# Patient Record
Sex: Female | Born: 1935 | Race: White | Hispanic: No | Smoking: Never smoker
Health system: Southern US, Community
[De-identification: ages and names within clinical notes are randomized; demographics above are authoritative.]

## PROBLEM LIST (undated history)

## (undated) DIAGNOSIS — K7460 Unspecified cirrhosis of liver: Secondary | ICD-10-CM

## (undated) DIAGNOSIS — R319 Hematuria, unspecified: Secondary | ICD-10-CM

## (undated) DIAGNOSIS — S72009A Fracture of unspecified part of neck of unspecified femur, initial encounter for closed fracture: Secondary | ICD-10-CM

## (undated) DIAGNOSIS — C229 Malignant neoplasm of liver, not specified as primary or secondary: Secondary | ICD-10-CM

## (undated) DIAGNOSIS — M1990 Unspecified osteoarthritis, unspecified site: Secondary | ICD-10-CM

## (undated) HISTORY — PX: SPLENECTOMY, TOTAL: SHX788

## (undated) HISTORY — PX: WRIST SURGERY: SHX841

## (undated) HISTORY — PX: ABDOMINAL HYSTERECTOMY: SHX81

## (undated) HISTORY — PX: APPENDECTOMY: SHX54

## (undated) HISTORY — PX: HIP SURGERY: SHX245

---

## 2010-06-30 ENCOUNTER — Emergency Department (HOSPITAL_BASED_OUTPATIENT_CLINIC_OR_DEPARTMENT_OTHER)
Admission: EM | Admit: 2010-06-30 | Discharge: 2010-06-30 | Payer: Self-pay | Source: Home / Self Care | Admitting: Emergency Medicine

## 2010-09-13 LAB — CBC
HCT: 38.4 % (ref 36.0–46.0)
Hemoglobin: 13.5 g/dL (ref 12.0–15.0)
MCH: 33.3 pg (ref 26.0–34.0)
MCHC: 35.2 g/dL (ref 30.0–36.0)
MCV: 94.8 fL (ref 78.0–100.0)
Platelets: 220 10*3/uL (ref 150–400)
RBC: 4.05 MIL/uL (ref 3.87–5.11)
RDW: 14 % (ref 11.5–15.5)
WBC: 10.5 10*3/uL (ref 4.0–10.5)

## 2010-09-13 LAB — COMPREHENSIVE METABOLIC PANEL
ALT: 58 U/L — ABNORMAL HIGH (ref 0–35)
AST: 69 U/L — ABNORMAL HIGH (ref 0–37)
Albumin: 4.2 g/dL (ref 3.5–5.2)
Alkaline Phosphatase: 128 U/L — ABNORMAL HIGH (ref 39–117)
BUN: 23 mg/dL (ref 6–23)
CO2: 28 mEq/L (ref 19–32)
Calcium: 10.9 mg/dL — ABNORMAL HIGH (ref 8.4–10.5)
Chloride: 104 mEq/L (ref 96–112)
Creatinine, Ser: 0.6 mg/dL (ref 0.4–1.2)
GFR calc Af Amer: >60 mL/min (ref >60)
GFR calc non Af Amer: >60 mL/min (ref >60)
Glucose, Bld: 109 mg/dL — ABNORMAL HIGH (ref 70–99)
Potassium: 4.3 mEq/L (ref 3.5–5.1)
Sodium: 143 mEq/L (ref 135–145)
Total Bilirubin: 0.6 mg/dL (ref 0.3–1.2)
Total Protein: 8.2 g/dL (ref 6.0–8.3)

## 2010-09-13 LAB — DIFFERENTIAL
Basophils Absolute: 0 10*3/uL (ref 0.0–0.1)
Basophils Relative: 0 % (ref 0–1)
Eosinophils Absolute: 0.3 10*3/uL (ref 0.0–0.7)
Eosinophils Relative: 3 % (ref 0–5)
Lymphocytes Relative: 31 % (ref 12–46)
Lymphs Abs: 3.2 10*3/uL (ref 0.7–4.0)
Monocytes Absolute: 1.5 10*3/uL — ABNORMAL HIGH (ref 0.1–1.0)
Monocytes Relative: 14 % — ABNORMAL HIGH (ref 3–12)
Neutro Abs: 5.4 10*3/uL (ref 1.7–7.7)
Neutrophils Relative %: 52 % (ref 43–77)

## 2010-09-13 LAB — PROTIME-INR
INR: 0.95 (ref 0.00–1.49)
Prothrombin Time: 12.9 seconds (ref 11.6–15.2)

## 2010-09-13 LAB — APTT: aPTT: 28 seconds (ref 24–37)

## 2019-04-25 ENCOUNTER — Other Ambulatory Visit: Payer: Self-pay | Admitting: Surgical Oncology

## 2019-04-25 DIAGNOSIS — C220 Liver cell carcinoma: Secondary | ICD-10-CM

## 2019-05-02 ENCOUNTER — Other Ambulatory Visit: Payer: Self-pay

## 2019-05-02 ENCOUNTER — Encounter: Payer: Self-pay | Admitting: *Deleted

## 2019-05-02 ENCOUNTER — Ambulatory Visit
Admission: RE | Admit: 2019-05-02 | Discharge: 2019-05-02 | Disposition: A | Discharge status: Home / Self Care | Payer: Self-pay | Source: Ambulatory Visit | Attending: Surgical Oncology | Admitting: Surgical Oncology

## 2019-05-02 DIAGNOSIS — C220 Liver cell carcinoma: Secondary | ICD-10-CM

## 2019-05-02 HISTORY — PX: IR RADIOLOGIST EVAL & MGMT: IMG5224

## 2019-05-02 NOTE — Consult Note (Signed)
Chief Complaint: Liver tumor  Referring Physician(s): Arredondo,Mark  History of Present Illness: Michelle Osborne is a 85 y.o. female presenting as a scheduled consultation to [place] [facility], kindly referred by Dr. Adair Laundry of the surgical service of [facility].   Michelle Osborne joined me today by telemedicine visit, given the COVID situation.    Michelle Osborne explained to me that her newly discovered [facility] was identified recently on MRI, which was performed after abnormal AFP was discovered.   AFP is 185 on recent test.    She has a history of cirrhosis, and was being screened.  MRI shows a 2cm-3cm liver lesion with TR5 characteristics.  The lesion is high segment 8, adjacent to the diaphragm.  There is 1 small additional lesion in the right liver that is non-specific, though will require follow up imaging surveillance.   She reports some morning nausea occurring 2-3 days per week, occasionally treated with alka-seltzer, and no vomiting.  Otherwise, she is symptom free from GI standpoint. She denies any abdominal pain.  She does take tramadol for leg pain and back pain.    She denies any prior MI.  She tells me about 40 years ago she had a complication during surgery for hysterectomy, which sounds like a peri-operative stroke.  She has had a long recovery, with some residual motor weakness it seems.  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  She tells me that she has had ABI screening, which was negative.   Her PCP is Dr. Truman Hayward.    She has had discussion with Dr. Adair Laundry, and I have discussed her case with him as well.   No past medical history on file.  Social History: She lives alone and manages all of her affairs.  She has ECOG of 1 She is a widow, 10 years ago. Her son died unfortunately of MCV (truck driver) recently Her daughter died unfortunately of cancer recently.   Allergies: Patient has no allergy  information on record.  Medications: Prior to Admission medications       150mg  Levothyroxine 140mg  lasix Potassium supplement Gabapentin HCTC 1-2mg  lorazepam Folic acid Tramadol prn 40mg  pantoprazole amlodipine   No family history on file.  Social History   Socioeconomic History  . Marital status: Widowed    Spouse name: Not on file  . Number of children: Not on file  . Years of education: Not on file  . Highest education level: Not on file  Occupational History  . Not on file  Social Needs  . Financial resource strain: Not on file  . Food insecurity    Worry: Not on file    Inability: Not on file  . Transportation needs    Medical: Not on file    Non-medical: Not on file  Tobacco Use  . Smoking status: Not on file  Substance and Sexual Activity  . Alcohol use: Not on file  . Drug use: Not on file  . Sexual activity: Not on file  Lifestyle  . Physical activity    Days per week: Not on file    Minutes per session: Not on file  . Stress: Not on file  Relationships  . Social Herbalist on phone: Not on file    Gets together: Not on file    Attends religious service: Not on file    Active member of club or organization: Not on  file    Attends meetings of clubs or organizations: Not on file    Relationship status: Not on file  Other Topics Concern  . Not on file  Social History Narrative  . Not on file    ECOG Status: 0 - Asymptomatic  Review of Systems  Review of Systems: A 12 point ROS discussed and pertinent positives are indicated in the HPI above.  All other systems are negative.  Physical Exam No direct physical exam was performed (except for noted visual exam findings with Video Visits).    Vital Signs: There were no vitals taken for this visit.  Imaging: No results found.  Labs:  CBC: No results for input(s): WBC, HGB, HCT, PLT in the last 8760 hours.  COAGS: No results for input(s): INR, APTT in the last 8760 hours.   BMP: No results for input(s): NA, K, CL, CO2, GLUCOSE, BUN, CALCIUM, CREATININE, GFRNONAA, GFRAA in the last 8760 hours.  Invalid input(s): CMP  LIVER FUNCTION TESTS: No results for input(s): BILITOT, AST, ALT, ALKPHOS, PROT, ALBUMIN in the last 8760 hours.  TUMOR MARKERS: No results for input(s): AFPTM, CEA, CA199, CHROMGRNA in the last 8760 hours.  Assessment and Plan:  Michelle Osborne is an 85 yo female with cirrhosis, and newly discovered 2cm-3cm [name] in segment 8 of the liver, when AFP screening test was elevated.   AFP is 185, and she is HCC Stage A (early stage).    I spent the majority of our time discussing the relevant anatomy and treatment options of early stage HCC.  Specifically, we discussed surgical options as the gold standard, and Dr. Adair Laundry has already had consultation, as well as liver directed therapy.    Our options would include image guided tissue ablation, or alternatively intra-arterial therapy (TACE or y90).  Given the small size of the tumor, the location directly on the diaphragm, and her complicating anatomy (body habitus, low lung volumes/arching diaphragm and segment 8), I would strongly consider working her up with intention of y90 therapy for segmentectomy.    During my description of selecting patients, she did voice to me that her preference was a short term follow up with active surveillance strategy rather than pursuing treatment at this time.  She would prefer a 25-month follow up.  This is because she feels she has some health issues right now that she is burdened, and that she does not feel motivated to address the [facility] at this time.   I did emphasize that the tumor is likely malignant ( ~95% confidence level by TR characteristics/TIRADS data), and that while we would expect growth, it will likely be slow growing.  Also, I did share with her our expectation that this would have very low risk of metastatic disease in the short term.    I did not go so far as  to complete an informed consent at this time for either tissue ablation or for y90 IA therapy.   She would like to use active surveillance strategy.   Plan: - 3 month CTA abdomen (/BRTO liver protocol for possible y90), with follow up visit to discuss results and possible treatment vs continued active surveillance. - 3 month follow up AFP - I have advised her to observe her other physician appointments.   Thank you for this interesting consult.  I greatly enjoyed meeting Michelle Osborne and look forward to participating in their care.  A copy of this report was sent to the requesting provider on this date.  Electronically  Signed: Corrie Mckusick 05/02/2019, 11:49 AM   I spent a total of  40 Minutes   in remote  clinical consultation, greater than 50% of which was counseling/coordinating care for [facility], possible liver directed therapy, active surveillance of [place].    Visit type: Audio only (telephone). Audio (no video) only due to no video. Alternative for in-person consultation at [facility], [address], [place], [place]. This visit type was conducted due to national recommendations for restrictions regarding the COVID-19 Pandemic (e.g. social distancing).  This format is felt to be most appropriate for this patient at this time.  All issues noted in this document were discussed and addressed.

## 2019-08-14 ENCOUNTER — Other Ambulatory Visit: Payer: Self-pay | Admitting: Interventional Radiology

## 2019-08-14 ENCOUNTER — Other Ambulatory Visit: Payer: Self-pay

## 2019-08-14 ENCOUNTER — Other Ambulatory Visit: Payer: Self-pay | Admitting: Radiology

## 2019-08-14 DIAGNOSIS — C220 Liver cell carcinoma: Secondary | ICD-10-CM

## 2019-08-19 ENCOUNTER — Other Ambulatory Visit: Payer: Self-pay

## 2019-08-19 DIAGNOSIS — C220 Liver cell carcinoma: Secondary | ICD-10-CM

## 2019-09-04 ENCOUNTER — Telehealth: Payer: Self-pay

## 2019-09-04 NOTE — Telephone Encounter (Signed)
Phone call to patient to review instructions for 13 hr prep for CT w/ contrast on 09/05/19  at 1 pm. [facility]. Pt aware and verbalized understanding of instructions. Prescription: 12 AM/ Midnight tonight (09/04/19)- 50mg  Prednisone 6 AM 09/05/19- 50mg  Prednisone 12PM/ noon 09/05/19  - 50mg  Prednisone and 50mg  Benadryl  Pt reports "I had contrast in 1974[birth year]10[birth year] and I died from it". Pt reports an anaphylaxis type reaction, stating "I became short of breath, next thing I knew the Dr was on top of me giving me medication and doing CPR". I have advised the scheduler, [place] with [facility], and Dr. Corrie Mckusick (ordering physician) of her reaction and they have ordered her scan to be done at the hospital, but with a 13 hour prep called in. Pt has verbalized understanding of the instructions.

## 2019-09-12 ENCOUNTER — Ambulatory Visit
Admission: RE | Admit: 2019-09-12 | Discharge: 2019-09-12 | Disposition: A | Discharge status: Home / Self Care | Payer: Medicare Other | Source: Ambulatory Visit | Attending: Interventional Radiology | Admitting: Interventional Radiology

## 2019-09-12 ENCOUNTER — Encounter: Payer: Self-pay | Admitting: *Deleted

## 2019-09-12 ENCOUNTER — Other Ambulatory Visit: Payer: Self-pay

## 2019-09-12 HISTORY — PX: IR RADIOLOGIST EVAL & MGMT: IMG5224

## 2019-09-12 NOTE — Progress Notes (Signed)
Chief Complaint: [place]   Referring Physician(s): Arredondo,Mark  History of Present Illness: Trinnity Sleet is a 86 y.o. female presenting as a scheduled follow up to [place] [facility], for presumed [facility] and to continue discussion of her care using active surveillance strategy.   Ms Pautsch joined me today by telemedicine visit, given the COVID situation.  I confirmed identity using 2 personal identifiers.  Ms Penilla has been doing just fine with no recent hospitalizations, and with no changes in her health since the last time we spoke, which was our initial consult 05/02/2019.   She has a history of cirrhosis, and screening study and subsequent MRI discoverd a 2cm-3cm liver lesion with TR5 characteristics 04/11/2019.    The lesion is high segment 8, adjacent to the diaphragm.  There is 1 small additional lesion in the right liver that is non-specific, though will require follow up imaging surveillance.   After our initial discussion, we elected a surveillance strategy, as she felt like she wanted to get some of her affairs in order and was not ready for treatment.  She tells me that she lives by herself, as her husband passed about 11 years ago.   We did get an interval CT abdomen/pelvis which required a standard 13 hour prep for a prior contrast reaction in 1970's. This was uneventful, and she was very complimentary of the [facility] CT staff.   CT shows that the lesion is essentially the same size as prior, with no new lesions.    Her PCP is Dr. Truman Hayward.     Allergies: Contrast media [iodinated diagnostic agents], Losartan, Morphine and related, Codeine, Ketorolac, Minocycline, Oxycodone, Penicillins, Sulfa antibiotics, Tapentadol hcl, Tetracyclines & related, Nucynta [tapentadol], and Doxycycline  Medications: Prior to Admission medications   Not on File     No family history on file.  Social History   Socioeconomic History  . Marital status: Widowed    Spouse name: Not on file    . Number of children: Not on file  . Years of education: Not on file  . Highest education level: Not on file  Occupational History  . Not on file  Tobacco Use  . Smoking status: Not on file  Substance and Sexual Activity  . Alcohol use: Not on file  . Drug use: Not on file  . Sexual activity: Not on file  Other Topics Concern  . Not on file  Social History Narrative  . Not on file   Social Determinants of Health   Financial Resource Strain:   . Difficulty of Paying Living Expenses:   Food Insecurity:   . Worried About Charity fundraiser in the Last Year:   . Arboriculturist in the Last Year:   Transportation Needs:   . Film/video editor (Medical):   Marland Kitchen Lack of Transportation (Non-Medical):   Physical Activity:   . Days of Exercise per Week:   . Minutes of Exercise per Session:   Stress:   . Feeling of Stress :   Social Connections:   . Frequency of Communication with Friends and Family:   . Frequency of Social Gatherings with Friends and Family:   . Attends Religious Services:   . Active Member of Clubs or Organizations:   . Attends Archivist Meetings:   Marland Kitchen Marital Status:     ECOG Status: 0 - Asymptomatic  Review of Systems  Review of Systems: A 12 point ROS discussed and pertinent positives are indicated in the  HPI above.  All other systems are negative.  Physical Exam No direct physical exam was performed (except for noted visual exam findings with Video Visits).    Vital Signs: There were no vitals taken for this visit.  Imaging: No results found.  Labs:  CBC: No results for input(s): WBC, HGB, HCT, PLT in the last 8760 hours.  COAGS: No results for input(s): INR, APTT in the last 8760 hours.  BMP: No results for input(s): NA, K, CL, CO2, GLUCOSE, BUN, CALCIUM, CREATININE, GFRNONAA, GFRAA in the last 8760 hours.  Invalid input(s): CMP  LIVER FUNCTION TESTS: No results for input(s): BILITOT, AST, ALT, ALKPHOS, PROT, ALBUMIN in  the last 8760 hours.  TUMOR MARKERS: No results for input(s): AFPTM, CEA, CA199, CHROMGRNA in the last 8760 hours.  Assessment and Plan:  Ms Lave is 86 yo female with a right liver LIRADS 5 lesion, ~2-3cm, which represents early stage [facility] by BCLC staging.   She has elected to employ active surveillance strategy after our first visit.    Today I briefly reviewed her updated imaging, as well as the treatment options, which, broadly speaking, would be surgical resection, image guided ablation, or intra-arterial embolization with bland, drug-eluting, or y90 particles.    I did let her know that I think the only loco-regional therapy that VIR would offer would be arterial embolization of either bland, drug-eluting, or y90 strategy.  I do not thing the size and location of the lesion is amenable to safe and efficient ablation.   After our discussion today, she would like to continue using the active surveillance strategy, as she is not yet ready to commit to treatment.   We will plan for 4-5 month follow up MRI.   Plan: - Continue active surveillance strategy for LIRADS 5 right liver lesion.  - Repeat abdominal MRI in 4-5 months - We can share the results by phone and discuss any need to change course.      Electronically Signed: Corrie Mckusick 09/12/2019, 12:12 PM   I spent a total of    25 Minutes in remote  clinical consultation, greater than 50% of which was counseling/coordinating care for liver lesion, possible locoregional therapy, active surveillance.    Visit type: Audio only (telephone). Audio (no video) only due to patient's lack of internet/smartphone capability. Alternative for in-person consultation at [facility], [address], [place], [place]. This visit type was conducted due to national recommendations for restrictions regarding the COVID-19 Pandemic (e.g. social distancing).  This format is felt to be most appropriate for this patient at this time.  All  issues noted in this document were discussed and addressed.

## 2019-12-31 ENCOUNTER — Other Ambulatory Visit: Payer: Self-pay | Admitting: Interventional Radiology

## 2019-12-31 ENCOUNTER — Other Ambulatory Visit: Payer: Self-pay

## 2019-12-31 DIAGNOSIS — C220 Liver cell carcinoma: Secondary | ICD-10-CM

## 2020-01-09 ENCOUNTER — Other Ambulatory Visit: Payer: Self-pay | Admitting: *Deleted

## 2020-01-09 DIAGNOSIS — C220 Liver cell carcinoma: Secondary | ICD-10-CM

## 2020-01-28 ENCOUNTER — Encounter: Payer: Self-pay | Admitting: *Deleted

## 2020-01-28 ENCOUNTER — Ambulatory Visit
Admission: RE | Admit: 2020-01-28 | Discharge: 2020-01-28 | Disposition: A | Discharge status: Home / Self Care | Payer: Medicare Other | Source: Ambulatory Visit | Attending: Interventional Radiology | Admitting: Interventional Radiology

## 2020-01-28 ENCOUNTER — Other Ambulatory Visit: Payer: Self-pay

## 2020-01-28 DIAGNOSIS — C220 Liver cell carcinoma: Secondary | ICD-10-CM

## 2020-01-28 HISTORY — PX: IR RADIOLOGIST EVAL & MGMT: IMG5224

## 2020-01-28 NOTE — Progress Notes (Signed)
Chief Complaint: [facility]   Referring Physician(s): Dr. Beverely Pace  History of Present Illness: Michelle Osborne is a 86 y.o. female presenting as a scheduled follow up to [place] clinic today, for her continued care regarding active surveillance strategy of a presumed right liver HCC.   Michelle Osborne joins [place] today virtually, and we confirmed her identity with 2 personal identifiers.   We first met her 05/02/2019, when she was kindly referred by Dr. Adair Laundry to consider potential palliative/liver directed therapy for her right liver HCC.  She is not a surgical candidate for cure.   We last saw her in the office 09/12/2019.  Since then, she tells me she had an unfortunate fall at home, and he fractured her right hip.  This was early May, and she was then hospitalized requiring right hip arthoplasty/fixation.  She was in the hospital at [facility] for a few weeks, and then has been at a rehab facility until just recently doing rehab.  She now has been at home for a few weeks with 2 rehab visits per week.  She does live alone, but she has lots of help from her family and friends.    She feels she is able to perform most everything she needs to do at home, but does say that her right hip is not "where she thinks it should be" with recovery.    She does tell me that otherwise, the only other symptom that she has now is a nagging cough, without fever, rigors, chills.  I let her know that this is very unlikely to be related to the liver/tumor.   Imaging review/comparison, using a diagonal right-left measurement for each of the following:  -CTA performed 09/05/19 shows that the lesion on coronal image 46 of series #9 has a diagonal measurement of ~1m.  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      Allergies: Contrast media [iodinated diagnostic agents], Losartan, Morphine and related, Codeine, Ketorolac, Minocycline, Oxycodone, Penicillins, Sulfa antibiotics, Tapentadol hcl, Tetracyclines & related, Nucynta [tapentadol], and Doxycycline  Medications: Prior to Admission medications   Not on File     No family history on file.  Social History   Socioeconomic History  . Marital status: Widowed    Spouse name: Not on file  . Number of children: Not on file  . Years of education: Not on file  . Highest education level: Not on file  Occupational History  . Not on file  Tobacco Use  . Smoking status: Not on file  Substance and Sexual Activity  . Alcohol use: Not on file  . Drug use: Not on file  . Sexual activity: Not on file  Other Topics Concern  . Not on file  Social History Narrative  . Not on file   Social Determinants of Health   Financial Resource Strain:   . Difficulty of Paying Living Expenses:   Food Insecurity:   . Worried About RunnCharity fundraiserthe Last Year:   . Ran [employer]  in the Last Year:   Transportation Needs:   . Film/video editor (Medical):   Marland Kitchen Lack of Transportation (Non-Medical):   Physical Activity:   . Days of Exercise per Week:   . Minutes of Exercise per Session:   Stress:   . Feeling of Stress :   Social Connections:   . Frequency of Communication with Friends and Family:   . Frequency of Social Gatherings with Friends and Family:   . Attends Religious Services:   . Active Member of Clubs or Organizations:   . Attends Archivist Meetings:   Marland Kitchen Marital Status:     ECOG Status: 0 - Asymptomatic  Review of Systems  Review of Systems: A 12 point ROS  discussed and pertinent positives are indicated in the HPI above.  All other systems are negative.  Physical Exam No direct physical exam was performed (except for noted visual exam findings with Video Visits).    Vital Signs: There were no vitals taken for this visit.  Imaging: No results found.  Labs:  CBC: No results for input(s): WBC, HGB, HCT, PLT in the last 8760 hours.  COAGS: No results for input(s): INR, APTT in the last 8760 hours.  BMP: No results for input(s): NA, K, CL, CO2, GLUCOSE, BUN, CALCIUM, CREATININE, GFRNONAA, GFRAA in the last 8760 hours.  Invalid input(s): CMP  LIVER FUNCTION TESTS: No results for input(s): BILITOT, AST, ALT, ALKPHOS, PROT, ALBUMIN in the last 8760 hours.  TUMOR MARKERS: No results for input(s): AFPTM, CEA, CA199, CHROMGRNA in the last 8760 hours.  Assessment and Plan:  Michelle Osborne is a very pleasant 86 yo female with a right liver LIRADS-5 lesion, early [place].    We have been using active surveillance strategy, as she is not ready to commit to a therapy.  In fact, during today's discussion, she told me she felt very strongly that she would never accept any chemotherapy.  I did reinforce to her that I think the need for any systemic therapy for such a lesion is very unlikely.    Today I we reviewed her updated imaging, and former imaging as well as the treatment options, which, broadly speaking, would be surgical resection (for which she is not a candidate), image guided ablation, or intra-arterial embolization with bland, drug-eluting, or y90 particles.    I reminder her that I feel that ablation will be difficult to assure an adequate and safe treatment, and that I think the best loco-regional therapy that VIR would offer would be arterial embolization of either bland, drug-eluting, or y90 strategy. I would favor y90.   We also reviewed the concept of active surveillance, which I think is very reasonable given that there has been very  little change over time, her current decreased reserve since recent hip fracture/surgery, and her conservative nature.   After our discussion she has elected to proceed with active surveillance, with our next visit in December.   Plan: - Continue active surveillance strategy of early [place], right liver dome, with ~6 month follow up office visit in December with repeat AFP and MRI abdomen, without and with contrast - I have also encouraged her to follow up with her pulmonary team, given her complaint of cough.  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  - Continue current care     Electronically Signed: Corrie Mckusick  01/28/2020, 10:17 AM   I spent a total of    40 Minutes in remote  clinical consultation, greater than 50% of which was counseling/coordinating care for right liver early [facility], active surveillance strategy.    Visit type: Audio only (telephone). Audio (no video) only due to patient's lack of internet/smartphone capability. Alternative for in-person consultation at [facility], [place] [address] [place], [place], [place]. This visit type was conducted due to national recommendations for restrictions regarding the COVID-19 Pandemic (e.g. social distancing).  This format is felt to be most appropriate for this patient at this time.  All issues noted in this document were discussed and addressed.

## 2020-07-13 ENCOUNTER — Telehealth: Payer: Self-pay

## 2020-07-13 ENCOUNTER — Other Ambulatory Visit: Payer: Self-pay

## 2020-07-13 ENCOUNTER — Other Ambulatory Visit: Payer: Self-pay | Admitting: Interventional Radiology

## 2020-07-13 DIAGNOSIS — C220 Liver cell carcinoma: Secondary | ICD-10-CM

## 2020-07-13 MED ORDER — PREDNISONE 50 MG PO TABS: ORAL_TABLET | ORAL | 0 refills | Status: AC

## 2020-07-13 NOTE — Telephone Encounter (Signed)
Received a phone call from scheduler, Randa Spike, stating pt would need a 13 hour prep for a scan that Dr. Earleen Newport is ordering. I called and personally spoke to the pt regarding her allergies. Pt can not recall if she is allergic to CT contrast or MRI contrast but does state "it killed me and they brought me back".  I do see that the patient had an MRI in July 2022 but patient can not recall if she had a 13 hour prep before or not. Therefore, a 13 hour prep will be called in on this patient, per Dr. Earleen Newport, due to the patients severe reaction. Prednisone [facility]. Pt verbalized understanding of medications.

## 2020-07-23 ENCOUNTER — Other Ambulatory Visit: Payer: Self-pay

## 2020-07-23 ENCOUNTER — Emergency Department (HOSPITAL_BASED_OUTPATIENT_CLINIC_OR_DEPARTMENT_OTHER)
Admission: EM | Admit: 2020-07-23 | Discharge: 2020-07-23 | Disposition: A | Discharge status: Home / Self Care | Payer: Medicare Other | Attending: Emergency Medicine | Admitting: Emergency Medicine

## 2020-07-23 ENCOUNTER — Emergency Department (HOSPITAL_BASED_OUTPATIENT_CLINIC_OR_DEPARTMENT_OTHER): Payer: Medicare Other

## 2020-07-23 ENCOUNTER — Encounter (HOSPITAL_BASED_OUTPATIENT_CLINIC_OR_DEPARTMENT_OTHER): Payer: Self-pay | Admitting: *Deleted

## 2020-07-23 DIAGNOSIS — Z8505 Personal history of malignant neoplasm of liver: Secondary | ICD-10-CM | POA: Insufficient documentation

## 2020-07-23 DIAGNOSIS — Z79899 Other long term (current) drug therapy: Secondary | ICD-10-CM | POA: Diagnosis not present

## 2020-07-23 DIAGNOSIS — R319 Hematuria, unspecified: Secondary | ICD-10-CM | POA: Insufficient documentation

## 2020-07-23 DIAGNOSIS — N3289 Other specified disorders of bladder: Secondary | ICD-10-CM

## 2020-07-23 HISTORY — DX: Hematuria, unspecified: R31.9

## 2020-07-23 HISTORY — DX: Malignant neoplasm of liver, not specified as primary or secondary: C22.9

## 2020-07-23 HISTORY — DX: Unspecified cirrhosis of liver: K74.60

## 2020-07-23 HISTORY — DX: Fracture of unspecified part of neck of unspecified femur, initial encounter for closed fracture: S72.009A

## 2020-07-23 HISTORY — DX: Unspecified osteoarthritis, unspecified site: M19.90

## 2020-07-23 LAB — COMPREHENSIVE METABOLIC PANEL
ALT: 20 U/L (ref 0–44)
AST: 39 U/L (ref 15–41)
Albumin: 4 g/dL (ref 3.5–5.0)
Alkaline Phosphatase: 144 U/L — ABNORMAL HIGH (ref 38–126)
Anion gap: 12 (ref 5–15)
BUN: 18 mg/dL (ref 8–23)
CO2: 30 mmol/L (ref 22–32)
Calcium: 10.3 mg/dL (ref 8.9–10.3)
Chloride: 97 mmol/L — ABNORMAL LOW (ref 98–111)
Creatinine, Ser: 0.74 mg/dL (ref 0.44–1.00)
GFR, Estimated: >60 mL/min (ref >60)
Glucose, Bld: 118 mg/dL — ABNORMAL HIGH (ref 70–99)
Potassium: 3.2 mmol/L — ABNORMAL LOW (ref 3.5–5.1)
Sodium: 139 mmol/L (ref 135–145)
Total Bilirubin: 0.4 mg/dL (ref 0.3–1.2)
Total Protein: 8.5 g/dL — ABNORMAL HIGH (ref 6.5–8.1)

## 2020-07-23 LAB — CBC WITH DIFFERENTIAL/PLATELET
Abs Immature Granulocytes: 0.04 10*3/uL (ref 0.00–0.07)
Basophils Absolute: 0.1 10*3/uL (ref 0.0–0.1)
Basophils Relative: 1 %
Eosinophils Absolute: 0.6 10*3/uL — ABNORMAL HIGH (ref 0.0–0.5)
Eosinophils Relative: 4 %
HCT: 40.6 % (ref 36.0–46.0)
Hemoglobin: 13.5 g/dL (ref 12.0–15.0)
Immature Granulocytes: 0 %
Lymphocytes Relative: 15 %
Lymphs Abs: 1.8 10*3/uL (ref 0.7–4.0)
MCH: 30.9 pg (ref 26.0–34.0)
MCHC: 33.3 g/dL (ref 30.0–36.0)
MCV: 92.9 fL (ref 80.0–100.0)
Monocytes Absolute: 1.8 10*3/uL — ABNORMAL HIGH (ref 0.1–1.0)
Monocytes Relative: 14 %
Neutro Abs: 8.4 10*3/uL — ABNORMAL HIGH (ref 1.7–7.7)
Neutrophils Relative %: 66 %
Platelets: 311 10*3/uL (ref 150–400)
RBC: 4.37 MIL/uL (ref 3.87–5.11)
RDW: 17.4 % — ABNORMAL HIGH (ref 11.5–15.5)
WBC: 12.7 10*3/uL — ABNORMAL HIGH (ref 4.0–10.5)
nRBC: 0 % (ref 0.0–0.2)

## 2020-07-23 LAB — URINALYSIS, ROUTINE W REFLEX MICROSCOPIC

## 2020-07-23 LAB — URINALYSIS, MICROSCOPIC (REFLEX): RBC / HPF: >50 RBC/hpf (ref 0–5)

## 2020-07-23 MED ORDER — SODIUM CHLORIDE 0.9 % IV BOLUS
1000.0000 mL | Freq: Once | INTRAVENOUS | Status: AC
  Administered 2020-07-23: 1000 mL via INTRAVENOUS

## 2020-07-23 NOTE — ED Triage Notes (Signed)
Hematuria since last night. States she had the same after breaking her hip in May. Her MD is aware and said not to be concerned.

## 2020-07-23 NOTE — Discharge Instructions (Signed)
Please follow-up with urology.  He may have a bladder mass that will need further evaluation  Expect to have persistent hematuria so please stay hydrated. Continue taking your Lasix as prescribed by your doctor.  Return to ER if you are unable to urinate, severe bladder pain, vomiting, fevers

## 2020-07-23 NOTE — ED Provider Notes (Signed)
[facility] EMERGENCY DEPARTMENT Provider Note   CSN: [id number] Arrival date & time: 07/23/20  1455     History Chief Complaint  Patient presents with  . Hematuria    Michelle Osborne is a 87 y.o. female history of cirrhosis from liver cancer, here presenting with hematuria.  Patient has been having intermittent hematuria since April of last year.  She states that she was in the nursing home at that time and she had a urinalysis that just showed blood and was told that she just needs follow-up. She never followed up with urology.  Patient states that she had severe bladder pain yesterday.  She then was able to urinate and had a large clot that came out and persistent hematuria.  Patient states that she is not on any blood thinners.  Has no known bladder or kidney cancer  The history is provided by the patient.       Past Medical History:  Diagnosis Date  . Arthritis   . Cirrhosis of liver ([place])   . Hematuria   . Hip fracture ([place])   . Liver cancer ([place])     There are no problems to display for this patient.   Past Surgical History:  Procedure Laterality Date  . ABDOMINAL HYSTERECTOMY    . APPENDECTOMY    . HIP SURGERY    . IR RADIOLOGIST EVAL & MGMT  05/02/2019  . IR RADIOLOGIST EVAL & MGMT  09/12/2019  . IR RADIOLOGIST EVAL & MGMT  01/28/2020  . SPLENECTOMY, TOTAL    . WRIST SURGERY       OB History   No obstetric history on file.     No family history on file.  Social History   Tobacco Use  . Smoking status: Never Smoker  . Smokeless tobacco: Never Used  Substance Use Topics  . Alcohol use: Not Currently  . Drug use: Never    Home Medications Prior to Admission medications   Medication Sig Start Date End Date Taking? Authorizing Provider  amLODipine (NORVASC) 10 MG tablet Take by mouth. 04/20/20  Yes [provider]  ascorbic acid (VITAMIN C) 500 MG tablet Take by mouth. 11/08/19  Yes [provider]  B Complex Vitamins  (VITAMIN B COMPLEX) TABS Take 1 tablet by mouth daily.   Yes [provider]  calcium carbonate (OS-CAL - DOSED IN MG OF ELEMENTAL CALCIUM) 1250 (500 Ca) MG tablet Take by mouth. 11/08/19  Yes [provider]  flecainide (TAMBOCOR) 150 MG tablet Take 150 mg by mouth 2 (two) times daily. 04/30/20  Yes [provider]  gabapentin (NEURONTIN) 400 MG capsule Take by mouth. 01/23/18  Yes [provider]  hydrochlorothiazide (HYDRODIURIL) 12.5 MG tablet Take by mouth. 04/20/20  Yes [provider]  levothyroxine (SYNTHROID) 175 MCG tablet Take 175 mcg by mouth daily. 07/01/20  Yes [provider]  LORazepam (ATIVAN) 2 MG tablet Take 2 mg by mouth 3 (three) times daily as needed. 06/10/20  Yes [provider]  Multiple Vitamin (QUINTABS) TABS Take by mouth. 11/08/19  Yes [provider]  pantoprazole (PROTONIX) 40 MG tablet Take by mouth. 08/09/19 04/20/21 Yes [provider]  potassium chloride SA (KLOR-CON) 20 MEQ tablet Take by mouth. 09/01/17  Yes [provider]  predniSONE (DELTASONE) 10 MG tablet Take 2 tabs daily x 5 days, then 1.5 tabs daily x 5 days, then 1 tab daily x 5 days, then 0.5 tabs daily x 5 days 07/21/20  Yes  [provider]  senna-docusate (SENOKOT-S) 8.6-50 MG tablet Take by mouth. 11/08/19  Yes [provider]  predniSONE (DELTASONE) 50 MG tablet Pt to take 50 mg of prednisone on 08/05/20 at 9 pm, 08/06/20 at 3 am, and 08/06/20 9 am. Pt also to take 50 mg of benadryl at 08/06/20 at 9 am. 07/13/20   Corrie Mckusick, DO    Allergies    Contrast media [iodinated diagnostic agents], Losartan, Morphine and related, Codeine, Ketorolac, Minocycline, Oxycodone, Penicillins, Sulfa antibiotics, Tapentadol hcl, Tetracyclines & related, Nucynta [tapentadol], and Doxycycline  Review of Systems   Review of Systems  Genitourinary: Positive for hematuria.  All other systems reviewed and are  negative.   Physical Exam Updated Vital Signs BP (!) 152/68 (BP Location: Right Arm)   Pulse 68   Temp 97.8 F (36.6 C) (Oral)   Resp 18   Ht 5\' 10"  (1.778 m)   Wt 104.3 kg   SpO2 98%   BMI 33.00 kg/m   Physical Exam Vitals and nursing note reviewed.  Constitutional:      Appearance: Normal appearance.  HENT:     Head: Normocephalic.     Nose: Nose normal.     Mouth/Throat:     Mouth: Mucous membranes are moist.  Eyes:     Extraocular Movements: Extraocular movements intact.     Pupils: Pupils are equal, round, and reactive to light.  Cardiovascular:     Rate and Rhythm: Normal rate and regular rhythm.     Pulses: Normal pulses.     Heart sounds: Normal heart sounds.  Pulmonary:     Effort: Pulmonary effort is normal.  Abdominal:     General: Abdomen is flat.     Palpations: Abdomen is soft.     Comments: No suprapubic tenderness or CVA tenderness.  Musculoskeletal:        General: Normal range of motion.     Cervical back: Normal range of motion and neck supple.  Skin:    General: Skin is warm.     Capillary Refill: Capillary refill takes less than 2 seconds.  Neurological:     General: No focal deficit present.     Mental Status: She is alert and oriented to person, place, and time.  Psychiatric:        Mood and Affect: Mood normal.        Behavior: Behavior normal.     ED Results / Procedures / Treatments   Labs (all labs ordered are listed, but only abnormal results are displayed) Labs Reviewed  URINALYSIS, ROUTINE W REFLEX MICROSCOPIC - Abnormal; Notable for the following components:      Result Value   Color, Urine RED (*)    APPearance TURBID (*)    Glucose, UA   (*)    Value: TEST NOT REPORTED DUE TO COLOR INTERFERENCE OF URINE PIGMENT   Hgb urine dipstick   (*)    Value: TEST NOT REPORTED DUE TO COLOR INTERFERENCE OF URINE PIGMENT   Bilirubin Urine   (*)    Value: TEST NOT REPORTED DUE TO COLOR INTERFERENCE OF URINE PIGMENT   Ketones, ur    (*)    Value: TEST NOT REPORTED DUE TO COLOR INTERFERENCE OF URINE PIGMENT   Protein, ur   (*)    Value: TEST NOT REPORTED DUE TO COLOR INTERFERENCE OF URINE PIGMENT   Nitrite   (*)    Value: TEST NOT REPORTED DUE TO COLOR INTERFERENCE OF URINE PIGMENT   Leukocytes,Ua   (*)  Value: TEST NOT REPORTED DUE TO COLOR INTERFERENCE OF URINE PIGMENT   All other components within normal limits  URINALYSIS, MICROSCOPIC (REFLEX) - Abnormal; Notable for the following components:   Bacteria, UA RARE (*)    All other components within normal limits  CBC WITH DIFFERENTIAL/PLATELET - Abnormal; Notable for the following components:   WBC 12.7 (*)    RDW 17.4 (*)    Neutro Abs 8.4 (*)    Monocytes Absolute 1.8 (*)    Eosinophils Absolute 0.6 (*)    All other components within normal limits  COMPREHENSIVE METABOLIC PANEL - Abnormal; Notable for the following components:   Potassium 3.2 (*)    Chloride 97 (*)    Glucose, Bld 118 (*)    Total Protein 8.5 (*)    Alkaline Phosphatase 144 (*)    All other components within normal limits    EKG None  Radiology CT Renal Stone Study  Addendum Date: 07/23/2020   ADDENDUM REPORT: 07/23/2020 19:19 ADDENDUM: These results were called by telephone at the time of physician contact on 07/23/2020 at 7:19 pm to provider Evangelyn Crouse , who verbally acknowledged these results. Electronically Signed   By: Lovena Le M.D.   On: 07/23/2020 19:19   Result Date: 07/23/2020 CLINICAL DATA:  Hematuria, unknown cause. History of liver cancer. A post splenectomy, appendectomy, hysterectomy. EXAM: CT ABDOMEN AND PELVIS WITHOUT CONTRAST TECHNIQUE: Multidetector CT imaging of the abdomen and pelvis was performed following the standard protocol without IV contrast. COMPARISON:  MRI 01/24/2020, CT 10/28/2019 FINDINGS: Lower chest: Regions of scarring and likely some mild rounded atelectasis are noted in the lung bases, right greater than left. Cardiac size is top normal. Mitral  annular calcifications. Coronary artery calcifications. Hepatobiliary: Nodular hepatic surface contour. Hypertrophy of the left lobe, similar to prior. Heterogenous lesion at the dome of the right lobe liver corresponding well with a lesion better characterized on prior MR imaging. A second site of possible mass or lesion in the right lobe is less well appreciated this exam in the absence of contrast media. Few punctate calcifications are similar to prior gallbladder is surgically absent. No significant biliary dilatation or visible calcified gallstones. Pancreas: Partial fatty replacement of the pancreas. No pancreatic ductal dilatation or surrounding inflammatory changes. Spleen: Prior splenectomy with few small splenic remnants in the left upper quadrant. Adrenals/Urinary Tract: Normal adrenals. Kidneys are normally. Stable mild symmetric bilateral perinephric stranding, a nonspecific finding which may correlate with advanced age or decreased renal function. stable bilobed cyst arising from the upper pole left kidney with small thin septate calcification (Bosniak 2). No discernible suspicious renal lesion. No visible urolithiasis or hydronephrosis of portion of the distal right ureter is obscured by streak artifact from the patient's right hip prosthesis there is redemonstrated and likely increasing nodularity along the right posterolateral bladder base with additional heterogeneously attenuating material within the bladder lumen likely reflecting hemorrhagic products. Stomach/Bowel: Small sliding-type hiatal hernia. Stomach and duodenum are unremarkable. No small bowel thickening or dilatation. The appendix is surgically absent. No proximal colonic thickening or dilatation. Distal colonic diverticulosis is present with some mild stranding centered upon several culprit diverticulum in the mid sigmoid (2/64). No extraluminal gas organized abscess or collection. Vascular/Lymphatic: Atherosclerotic calcifications  within the abdominal aorta and branch vessels. No aneurysm or ectasia. No enlarged abdominopelvic lymph nodes. Reproductive: Uterus is surgically absent. No concerning adnexal lesions. No concerning adnexal lesions though portions of the pelvis are obscured by streak. Other: Postsurgical changes in the posterior  midline from prior spinal decompression as well as in the right hip soft tissues from prior arthroplasty. Mild body wall edema. Edematous changes centered upon the sigmoid, as above. No free air or fluid in the abdomen or pelvis. Musculoskeletal: Prior right hip arthroplasty resulting in streak artifact across the pelvis. No gross acute complication is seen. Levocurvature of the spine similar to prior with multilevel discogenic and facet degenerative changes. Some erosive and destructive changes seen along the left anterolateral aspect of the L5-S1 disc space are unchanged from prior with associated vacuum phenomenon favoring a degenerative/chronic process. Posterior decompressive surgical changes are noted as well. [place] spots IMPRESSION: 1. Persistent and likely increasing nodularity along the right posterolateral bladder base with additional heterogeneously attenuating material within the bladder lumen likely reflecting hemorrhagic products. Findings are concerning for underlying bladder malignancy. Recommend further evaluation with cystoscopy. No obstructive urolithiasis or resulting hydronephrosis is seen however. 2. Distal colonic diverticulosis with some mild stranding centered upon several culprit diverticulum in the mid sigmoid colon, could reflect mild acute uncomplicated diverticulitis. No extraluminal gas or organized abscess or collection. 3. Heterogenous lesion at the dome of the right lobe liver corresponding well with a lesion better characterized on prior MR imaging. A second site of possible mass or lesion in the right lobe liver is less well appreciated this exam in the absence of contrast  media. 4. Stable bilobed cyst arising from the upper pole left kidney with small thin septate calcification (Bosniak 2). No discernible suspicious renal lesion. 5. Prior splenectomy and cholecystectomy. 6. Aortic Atherosclerosis (ICD10-I70.0). Currently attempting to contact the ordering provider with a critical value result. Addendum will be submitted upon case discussion. Electronically Signed: By: Lovena Le M.D. On: 07/23/2020 19:13    Procedures Procedures (including critical care time)  Medications Ordered in ED Medications  sodium chloride 0.9 % bolus 1,000 mL (1,000 mLs Intravenous New Bag/Given 07/23/20 1831)    ED Course  I have reviewed the triage vital signs and the nursing notes.  Pertinent labs & imaging results that were available during my care of the patient were reviewed by me and considered in my medical decision making (see chart for details).    MDM Rules/Calculators/A&P                         Michelle Osborne is a 87 y.o. female here present with hematuria.  Patient has been having intermittent hematuria for the last several months.  Has a large clot yesterday and has persistent hematuria since then. Patient was able to urinate in the ED and bladder scan showed no residual.  Patient's creatinine is stable and her UA showed hematuria but no obvious UTI.  Patient unable to get IV contrast so Noncon CT was performed and showed nodularity on the right side of the bladder concerning for possible cancer. Since patient is hemodynamically stable and not in retention, patient is stable for discharge home with urology follow-up. Told her to return if she is unable to urinate or severe bladder pain    Final Clinical Impression(s) / ED Diagnoses Final diagnoses:  None    Rx / DC Orders ED Discharge Orders    None       Drenda Freeze, MD 07/23/20 2005

## 2020-08-11 ENCOUNTER — Other Ambulatory Visit: Payer: Self-pay

## 2020-08-11 ENCOUNTER — Ambulatory Visit
Admission: RE | Admit: 2020-08-11 | Discharge: 2020-08-11 | Disposition: A | Discharge status: Home / Self Care | Payer: Medicare Other | Source: Ambulatory Visit | Attending: Interventional Radiology | Admitting: Interventional Radiology

## 2020-08-11 DIAGNOSIS — C220 Liver cell carcinoma: Secondary | ICD-10-CM

## 2020-09-01 DEATH — deceased

## 2022-03-29 IMAGING — CT CT RENAL STONE PROTOCOL
2 of 4 series · 11 of 46 positions shown, 12 images · non-contrast
Comparison: MRI 01/24/2020, CT 10/28/2019
COMPARISON: MRI 01/24/2020, CT 10/28/2019

Addendum:
CLINICAL DATA: Hematuria, unknown cause. History of liver cancer. A
post splenectomy, appendectomy, hysterectomy.

EXAM:
CT ABDOMEN AND PELVIS WITHOUT CONTRAST
TECHNIQUE: Multidetector CT imaging of the abdomen and pelvis was performed
following the standard protocol without IV contrast.

[Series 2: axial st · axial · 0.98mm/px · z∈[+611,+1006]mm · 8 of 95 slices shown, 9 images]
[im 8/95  soft-tissue]
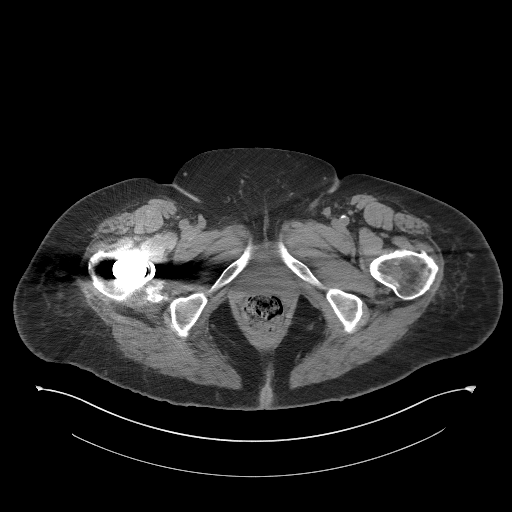
[im 8/95  bone]
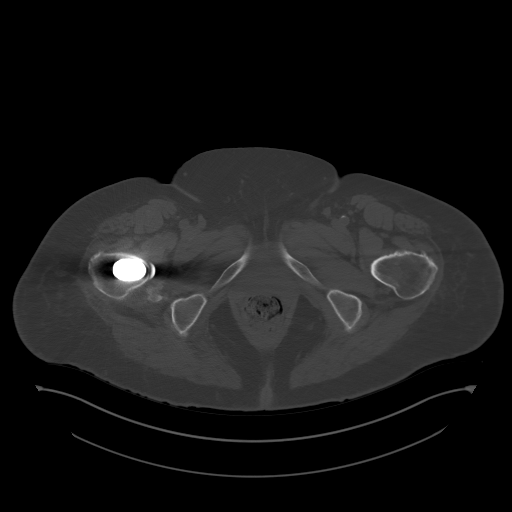
[im 24/95  soft-tissue]
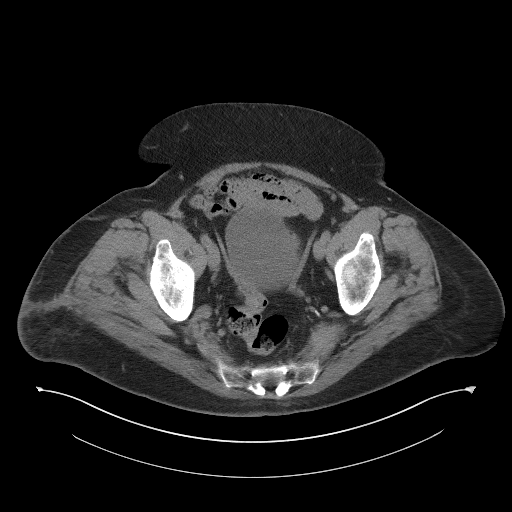
[im 32/95  soft-tissue]
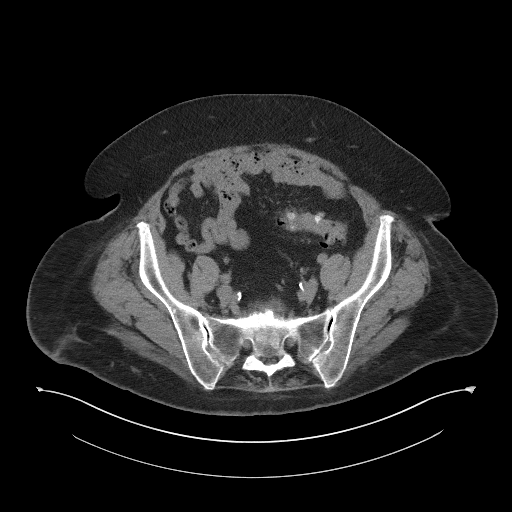
[im 44/95  soft-tissue]
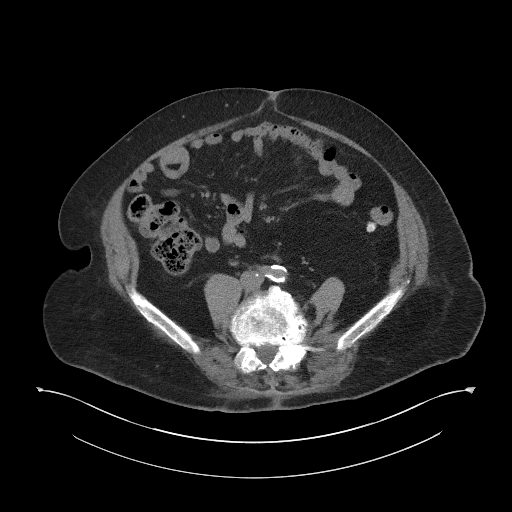
[im 55/95  soft-tissue]
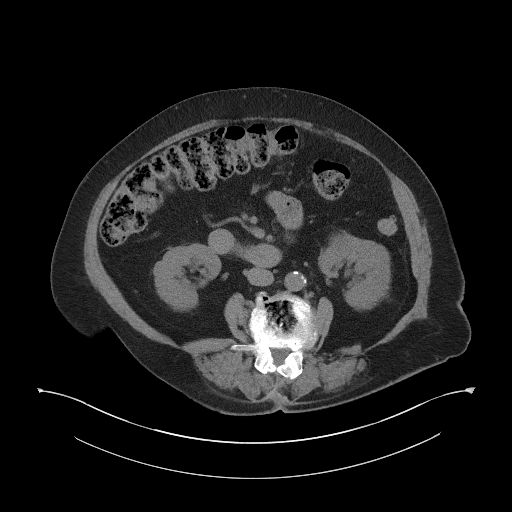
[im 67/95  soft-tissue]
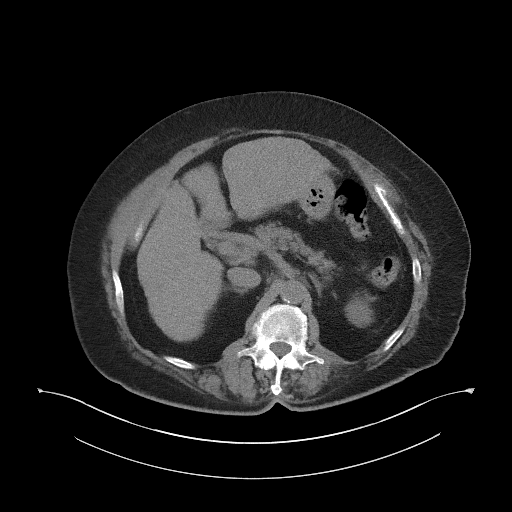
[im 75/95  soft-tissue]
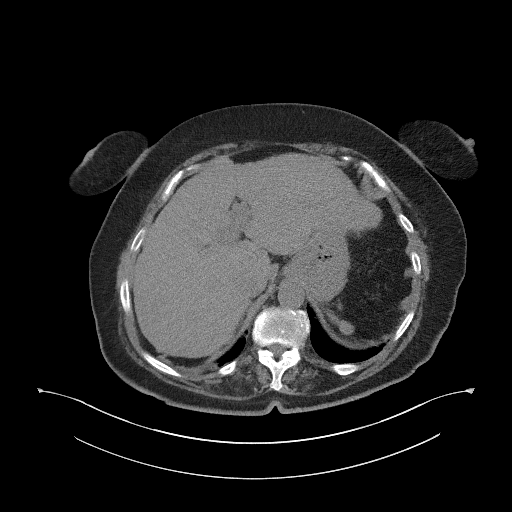
[im 87/95  soft-tissue]
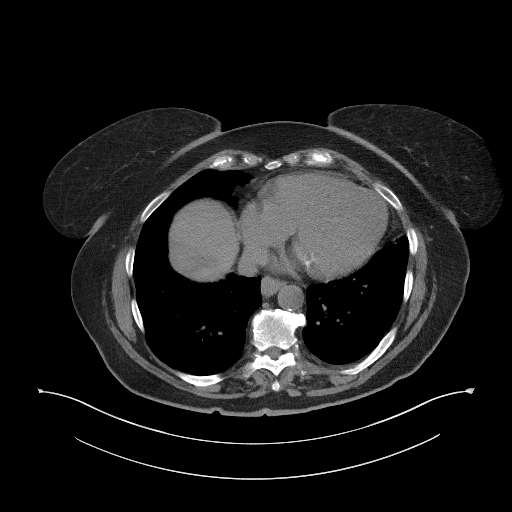

[Series 4: coronal st · coronal · 0.96mm/px · 3 of 132 slices shown]
[im 44/132  soft-tissue]
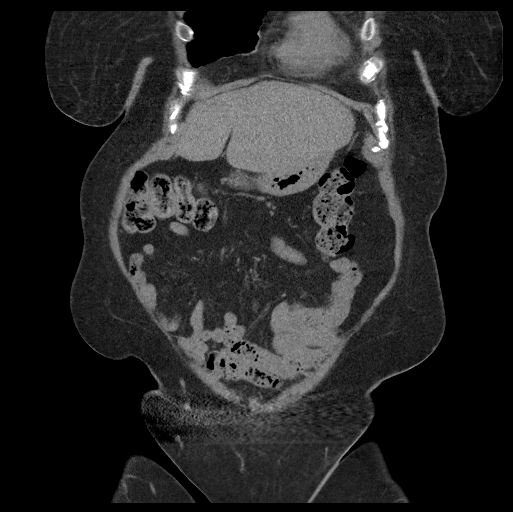
[im 59/132  soft-tissue]
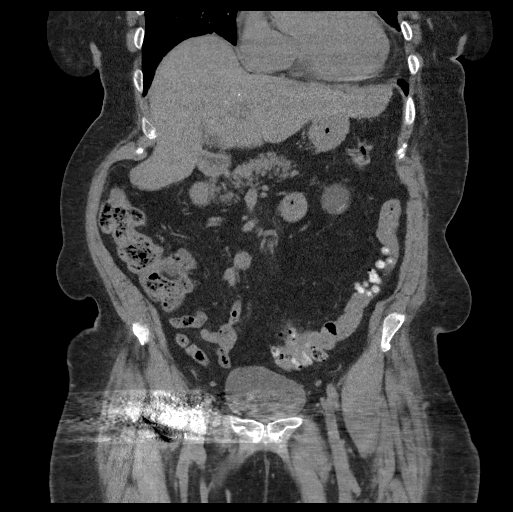
[im 73/132  soft-tissue]
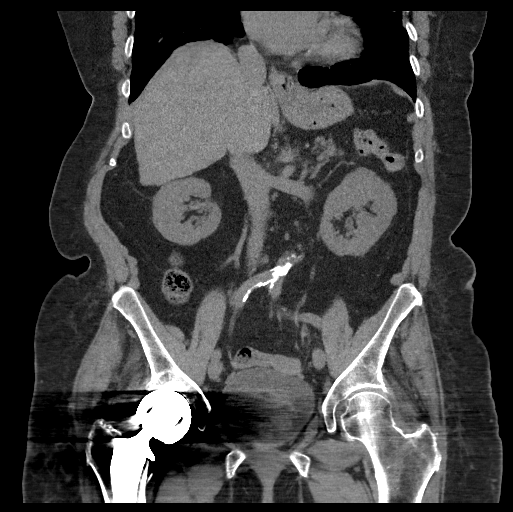

[11 of 46 positions shown; findings below may reference images not displayed]

FINDINGS: Lower chest: Regions of scarring and likely some mild rounded
atelectasis are noted in the lung bases, right greater than left.
Cardiac size is top normal. Mitral annular calcifications. Coronary
artery calcifications.

Hepatobiliary: Nodular hepatic surface contour. Hypertrophy of the
left lobe, similar to prior. Heterogenous lesion at the dome of the
right lobe liver corresponding well with a lesion better
characterized on prior MR imaging. A second site of possible mass or
lesion in the right lobe is less well appreciated this exam in the
absence of contrast media. Few punctate calcifications are similar
to prior gallbladder is surgically absent. No significant biliary
dilatation or visible calcified gallstones.

Pancreas: Partial fatty replacement of the pancreas. No pancreatic
ductal dilatation or surrounding inflammatory changes.

Spleen: Prior splenectomy with few small splenic remnants in the
left upper quadrant.

Adrenals/Urinary Tract: Normal adrenals. Kidneys are normally.
Stable mild symmetric bilateral perinephric stranding, a nonspecific
finding which may correlate with advanced age or decreased renal
function. stable bilobed cyst arising from the upper pole left
kidney with small thin septate calcification (Bosniak 2). No
discernible suspicious renal lesion. No visible urolithiasis or
hydronephrosis of portion of the distal right ureter is obscured by
streak artifact from the patient's right hip prosthesis there is
redemonstrated and likely increasing nodularity along the right
posterolateral bladder base with additional heterogeneously
attenuating material within the bladder lumen likely reflecting
hemorrhagic products.

Stomach/Bowel: Small sliding-type hiatal hernia. Stomach and
duodenum are unremarkable. No small bowel thickening or dilatation.
The appendix is surgically absent. No proximal colonic thickening or
dilatation. Distal colonic diverticulosis is present with some mild
stranding centered upon several culprit diverticulum in the mid
sigmoid (2/64). No extraluminal gas organized abscess or collection.

Vascular/Lymphatic: Atherosclerotic calcifications within the
abdominal aorta and branch vessels. No aneurysm or ectasia. No
enlarged abdominopelvic lymph nodes.

Reproductive: Uterus is surgically absent. No concerning adnexal
lesions. No concerning adnexal lesions though portions of the pelvis
are obscured by streak.

Other: Postsurgical changes in the posterior midline from prior
spinal decompression as well as in the right hip soft tissues from
prior arthroplasty. Mild body wall edema. Edematous changes centered
upon the sigmoid, as above. No free air or fluid in the abdomen or
pelvis.

Musculoskeletal: Prior right hip arthroplasty resulting in streak
artifact across the pelvis. No gross acute complication is seen.
Levocurvature of the spine similar to prior with multilevel
discogenic and facet degenerative changes. Some erosive and
destructive changes seen along the left anterolateral aspect of the
L5-S1 disc space are unchanged from prior with associated vacuum
phenomenon favoring a degenerative/chronic process. Posterior
decompressive surgical changes are noted as well. FOOSH spots
IMPRESSION: 1. Persistent and likely increasing nodularity along the right
posterolateral bladder base with additional heterogeneously
attenuating material within the bladder lumen likely reflecting
hemorrhagic products. Findings are concerning for underlying bladder
malignancy. Recommend further evaluation with cystoscopy. No
obstructive urolithiasis or resulting hydronephrosis is seen
however.
2. Distal colonic diverticulosis with some mild stranding centered
upon several culprit diverticulum in the mid sigmoid colon, could
reflect mild acute uncomplicated diverticulitis. No extraluminal gas
or organized abscess or collection.
3. Heterogenous lesion at the dome of the right lobe liver
corresponding well with a lesion better characterized on prior MR
imaging. A second site of possible mass or lesion in the right lobe
liver is less well appreciated this exam in the absence of contrast
media.
4. Stable bilobed cyst arising from the upper pole left kidney with
small thin septate calcification (Bosniak 2). No discernible
suspicious renal lesion.
5. Prior splenectomy and cholecystectomy.
6. Aortic Atherosclerosis (M2O6V-FK9.9).

Currently attempting to contact the ordering provider with a
critical value result. Addendum will be submitted upon case
discussion.

ADDENDUM:
These results were called by telephone at the time of physician
contact on 07/23/2020 at [DATE] to provider DEVENDRA BENTON , who verbally
acknowledged these results.

*** End of Addendum ***
FINDINGS: Lower chest: Regions of scarring and likely some mild rounded
atelectasis are noted in the lung bases, right greater than left.
Cardiac size is top normal. Mitral annular calcifications. Coronary
artery calcifications.

Hepatobiliary: Nodular hepatic surface contour. Hypertrophy of the
left lobe, similar to prior. Heterogenous lesion at the dome of the
right lobe liver corresponding well with a lesion better
characterized on prior MR imaging. A second site of possible mass or
lesion in the right lobe is less well appreciated this exam in the
absence of contrast media. Few punctate calcifications are similar
to prior gallbladder is surgically absent. No significant biliary
dilatation or visible calcified gallstones.

Pancreas: Partial fatty replacement of the pancreas. No pancreatic
ductal dilatation or surrounding inflammatory changes.

Spleen: Prior splenectomy with few small splenic remnants in the
left upper quadrant.

Adrenals/Urinary Tract: Normal adrenals. Kidneys are normally.
Stable mild symmetric bilateral perinephric stranding, a nonspecific
finding which may correlate with advanced age or decreased renal
function. stable bilobed cyst arising from the upper pole left
kidney with small thin septate calcification (Bosniak 2). No
discernible suspicious renal lesion. No visible urolithiasis or
hydronephrosis of portion of the distal right ureter is obscured by
streak artifact from the patient's right hip prosthesis there is
redemonstrated and likely increasing nodularity along the right
posterolateral bladder base with additional heterogeneously
attenuating material within the bladder lumen likely reflecting
hemorrhagic products.

Stomach/Bowel: Small sliding-type hiatal hernia. Stomach and
duodenum are unremarkable. No small bowel thickening or dilatation.
The appendix is surgically absent. No proximal colonic thickening or
dilatation. Distal colonic diverticulosis is present with some mild
stranding centered upon several culprit diverticulum in the mid
sigmoid (2/64). No extraluminal gas organized abscess or collection.

Vascular/Lymphatic: Atherosclerotic calcifications within the
abdominal aorta and branch vessels. No aneurysm or ectasia. No
enlarged abdominopelvic lymph nodes.

Reproductive: Uterus is surgically absent. No concerning adnexal
lesions. No concerning adnexal lesions though portions of the pelvis
are obscured by streak.

Other: Postsurgical changes in the posterior midline from prior
spinal decompression as well as in the right hip soft tissues from
prior arthroplasty. Mild body wall edema. Edematous changes centered
upon the sigmoid, as above. No free air or fluid in the abdomen or
pelvis.

Musculoskeletal: Prior right hip arthroplasty resulting in streak
artifact across the pelvis. No gross acute complication is seen.
Levocurvature of the spine similar to prior with multilevel
discogenic and facet degenerative changes. Some erosive and
destructive changes seen along the left anterolateral aspect of the
L5-S1 disc space are unchanged from prior with associated vacuum
phenomenon favoring a degenerative/chronic process. Posterior
decompressive surgical changes are noted as well. FOOSH spots
IMPRESSION: 1. Persistent and likely increasing nodularity along the right
posterolateral bladder base with additional heterogeneously
attenuating material within the bladder lumen likely reflecting
hemorrhagic products. Findings are concerning for underlying bladder
malignancy. Recommend further evaluation with cystoscopy. No
obstructive urolithiasis or resulting hydronephrosis is seen
however.
2. Distal colonic diverticulosis with some mild stranding centered
upon several culprit diverticulum in the mid sigmoid colon, could
reflect mild acute uncomplicated diverticulitis. No extraluminal gas
or organized abscess or collection.
3. Heterogenous lesion at the dome of the right lobe liver
corresponding well with a lesion better characterized on prior MR
imaging. A second site of possible mass or lesion in the right lobe
liver is less well appreciated this exam in the absence of contrast
media.
4. Stable bilobed cyst arising from the upper pole left kidney with
small thin septate calcification (Bosniak 2). No discernible
suspicious renal lesion.
5. Prior splenectomy and cholecystectomy.
6. Aortic Atherosclerosis (M2O6V-FK9.9).

Currently attempting to contact the ordering provider with a
critical value result. Addendum will be submitted upon case
discussion.
